# Patient Record
Sex: Female | Born: 1945 | Race: White | Hispanic: No | Marital: Married | Smoking: Never smoker
Health system: Southern US, Community
[De-identification: ages and names within clinical notes are randomized; demographics above are authoritative.]

## PROBLEM LIST (undated history)

## (undated) DIAGNOSIS — H269 Unspecified cataract: Secondary | ICD-10-CM

## (undated) DIAGNOSIS — C801 Malignant (primary) neoplasm, unspecified: Secondary | ICD-10-CM

## (undated) DIAGNOSIS — D649 Anemia, unspecified: Secondary | ICD-10-CM

## (undated) DIAGNOSIS — D7581 Myelofibrosis: Secondary | ICD-10-CM

## (undated) DIAGNOSIS — I10 Essential (primary) hypertension: Secondary | ICD-10-CM

## (undated) HISTORY — DX: Malignant (primary) neoplasm, unspecified: C80.1

## (undated) HISTORY — DX: Unspecified cataract: H26.9

## (undated) HISTORY — DX: Myelofibrosis: D75.81

## (undated) HISTORY — DX: Essential (primary) hypertension: I10

## (undated) HISTORY — PX: COSMETIC SURGERY: SHX468

## (undated) HISTORY — PX: BACK SURGERY: SHX140

## (undated) HISTORY — DX: Anemia, unspecified: D64.9

---

## 1999-02-26 ENCOUNTER — Other Ambulatory Visit: Admission: RE | Admit: 1999-02-26 | Discharge: 1999-02-26 | Payer: Self-pay | Admitting: *Deleted

## 2000-03-10 ENCOUNTER — Other Ambulatory Visit: Admission: RE | Admit: 2000-03-10 | Discharge: 2000-03-10 | Payer: Self-pay | Admitting: *Deleted

## 2004-09-08 HISTORY — PX: UMBILICAL HERNIA REPAIR: SHX196

## 2004-12-02 ENCOUNTER — Encounter: Admission: RE | Admit: 2004-12-02 | Discharge: 2004-12-02 | Payer: Self-pay | Admitting: General Surgery

## 2004-12-03 ENCOUNTER — Ambulatory Visit (HOSPITAL_BASED_OUTPATIENT_CLINIC_OR_DEPARTMENT_OTHER): Admission: RE | Admit: 2004-12-03 | Discharge: 2004-12-03 | Payer: Self-pay | Admitting: General Surgery

## 2004-12-03 ENCOUNTER — Ambulatory Visit (HOSPITAL_COMMUNITY): Admission: RE | Admit: 2004-12-03 | Discharge: 2004-12-03 | Payer: Self-pay | Admitting: General Surgery

## 2005-09-08 HISTORY — PX: APPENDECTOMY: SHX54

## 2006-02-19 IMAGING — CR DG CHEST 2V
2 series · 2 of 2 positions shown · non-contrast
Comparison: none

CLINICAL DATA: Pre op for repair of an umbilical hernia. 
 CHEST ? TWO VIEWS:
 The heart size and mediastinal contours are normal. The lungs are clear. The visualized skeleton is unremarkable.

 IMPRESSION
 No active lung disease.

[view not recorded (1 of 2)]
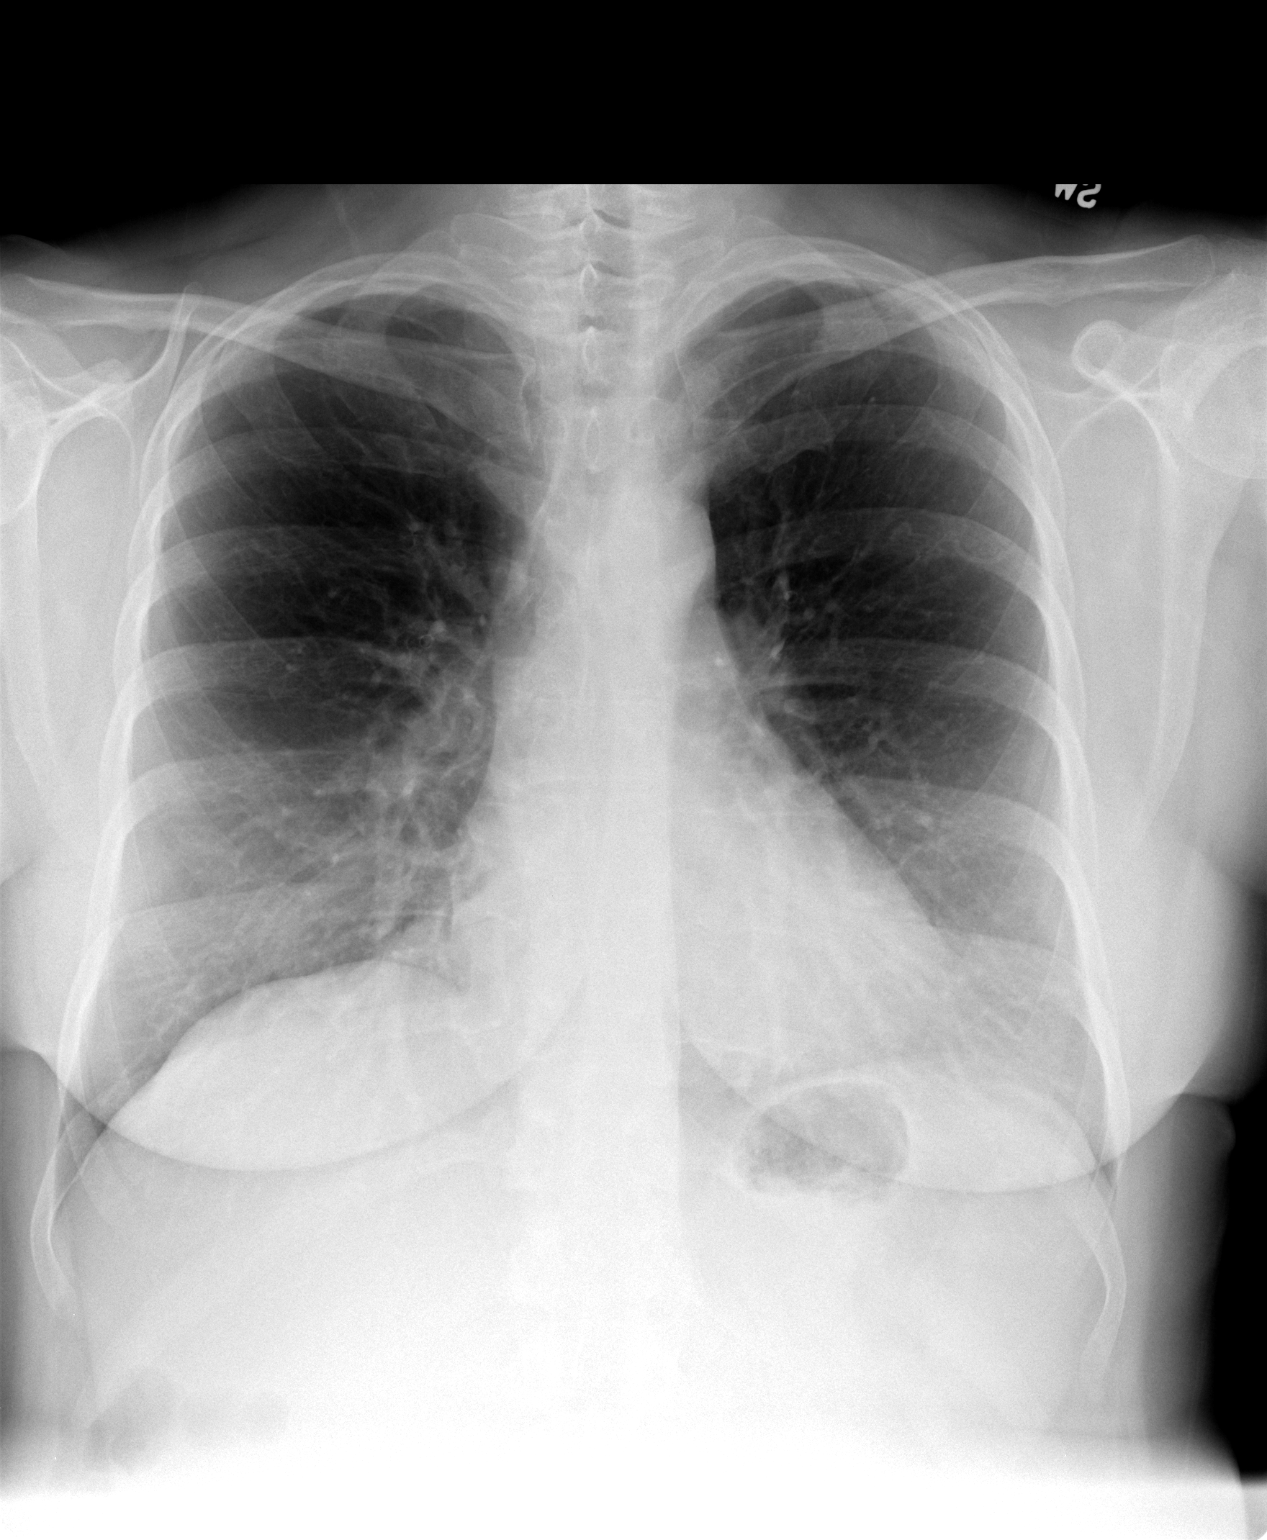

[view not recorded (2 of 2)]
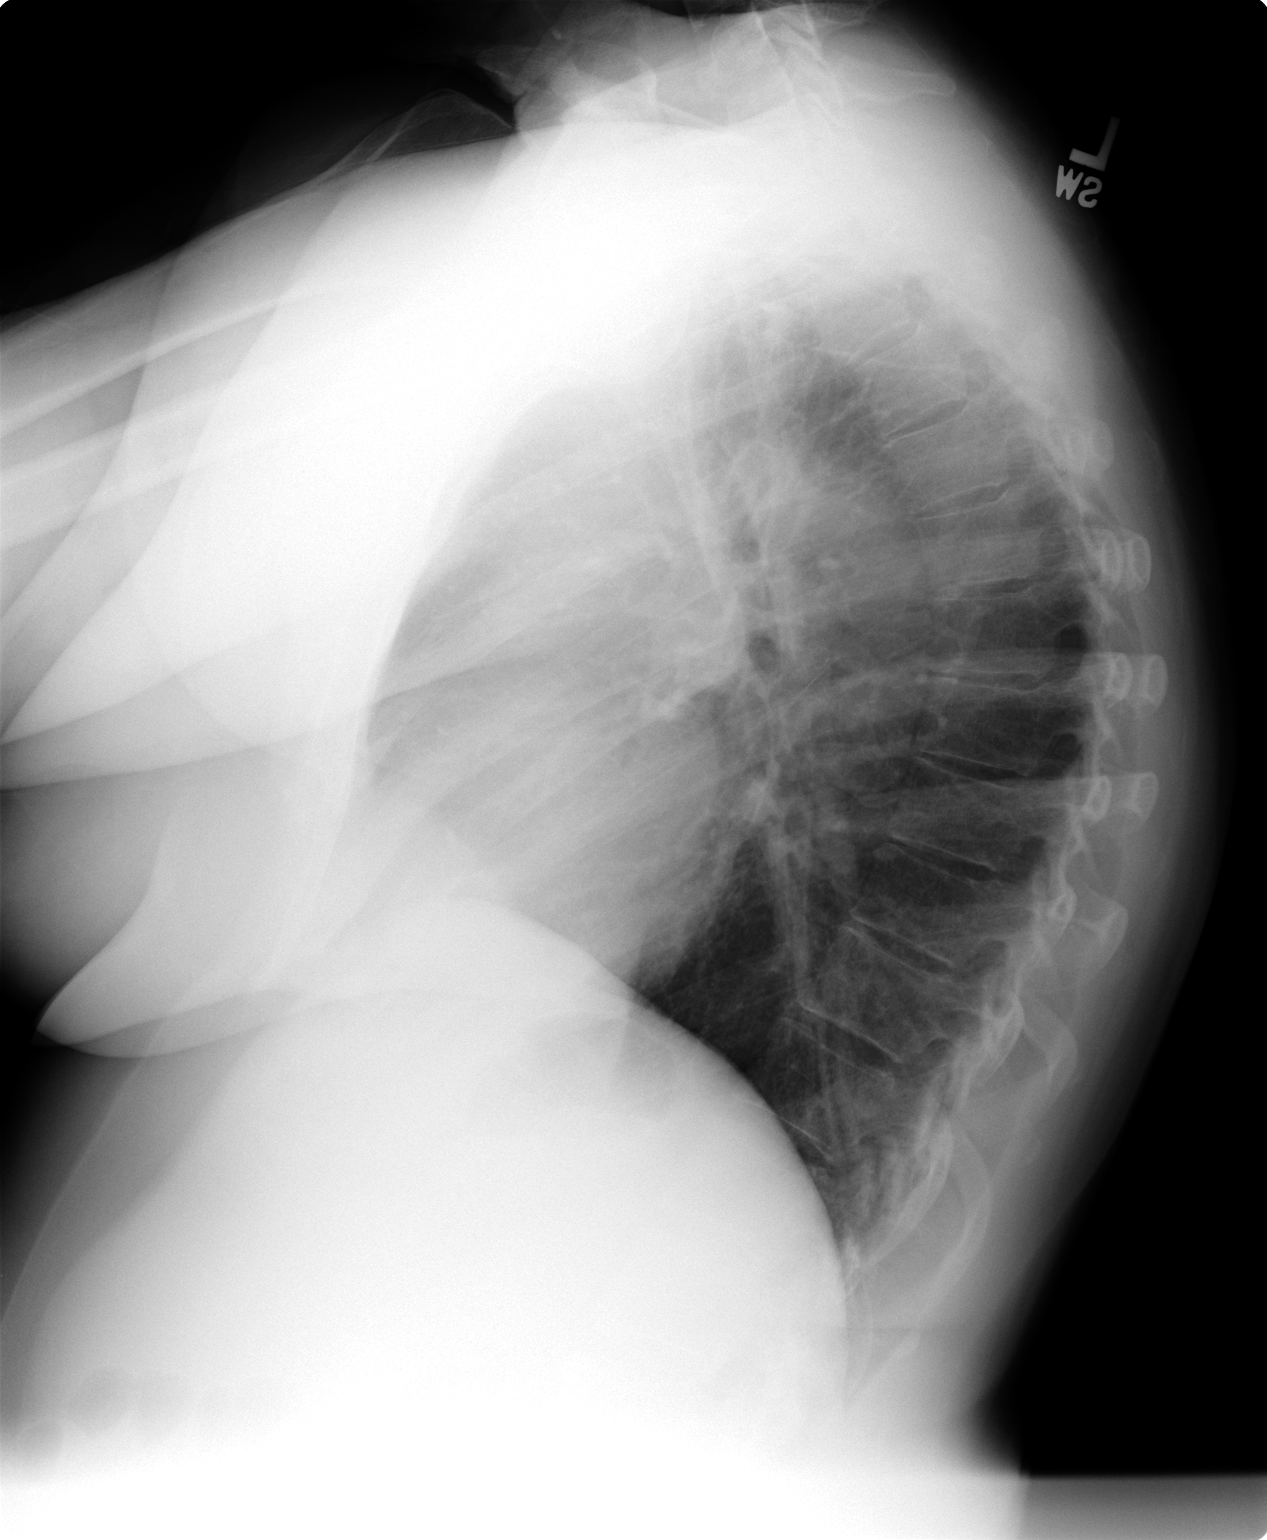

[2 of 2 positions shown; findings below may reference images not displayed]

## 2006-05-11 ENCOUNTER — Inpatient Hospital Stay (HOSPITAL_COMMUNITY): Admission: EM | Admit: 2006-05-11 | Discharge: 2006-05-15 | Payer: Self-pay | Admitting: Emergency Medicine

## 2006-05-11 ENCOUNTER — Encounter (INDEPENDENT_AMBULATORY_CARE_PROVIDER_SITE_OTHER): Payer: Self-pay | Admitting: *Deleted

## 2007-05-29 ENCOUNTER — Encounter: Payer: Self-pay | Admitting: Family Medicine

## 2007-12-31 ENCOUNTER — Encounter: Payer: Self-pay | Admitting: Family Medicine

## 2008-01-10 ENCOUNTER — Ambulatory Visit: Payer: Self-pay | Admitting: Internal Medicine

## 2008-01-13 LAB — MANUAL DIFFERENTIAL
Basophil: 5 % — ABNORMAL HIGH (ref 0–2)
EOS: 1 % (ref 0–7)
LYMPH: 7 % — ABNORMAL LOW (ref 14–49)
MONO: 2 % (ref 0–14)
Metamyelocytes: 15 % — ABNORMAL HIGH (ref 0–0)

## 2008-01-13 LAB — CBC WITH DIFFERENTIAL/PLATELET
HCT: 40 % (ref 34.8–46.6)
MCH: 28.1 pg (ref 26.0–34.0)
MCHC: 33.9 g/dL (ref 32.0–36.0)
Platelets: 442 10*3/uL — ABNORMAL HIGH (ref 145–400)
RBC: 4.82 10*6/uL (ref 3.70–5.32)

## 2008-01-14 LAB — COMPREHENSIVE METABOLIC PANEL
ALT: 23 U/L (ref 0–35)
AST: 29 U/L (ref 0–37)
Albumin: 4.1 g/dL (ref 3.5–5.2)
BUN: 14 mg/dL (ref 6–23)
CO2: 22 mEq/L (ref 19–32)
Calcium: 9.2 mg/dL (ref 8.4–10.5)
Chloride: 106 mEq/L (ref 96–112)
Potassium: 4.8 mEq/L (ref 3.5–5.3)

## 2008-01-14 LAB — LACTATE DEHYDROGENASE: LDH: 1333 U/L — ABNORMAL HIGH (ref 94–250)

## 2008-01-19 ENCOUNTER — Encounter: Payer: Self-pay | Admitting: Internal Medicine

## 2008-01-19 ENCOUNTER — Ambulatory Visit: Payer: Self-pay | Admitting: Internal Medicine

## 2008-01-19 ENCOUNTER — Ambulatory Visit (HOSPITAL_COMMUNITY): Admission: RE | Admit: 2008-01-19 | Discharge: 2008-01-19 | Payer: Self-pay | Admitting: Internal Medicine

## 2008-01-20 LAB — BCR/ABL

## 2008-01-24 ENCOUNTER — Ambulatory Visit (HOSPITAL_COMMUNITY): Admission: RE | Admit: 2008-01-24 | Discharge: 2008-01-24 | Payer: Self-pay | Admitting: Internal Medicine

## 2008-01-28 ENCOUNTER — Encounter: Payer: Self-pay | Admitting: Family Medicine

## 2008-02-28 ENCOUNTER — Ambulatory Visit: Payer: Self-pay | Admitting: Internal Medicine

## 2008-02-28 LAB — COMPREHENSIVE METABOLIC PANEL
AST: 23 U/L (ref 0–37)
Alkaline Phosphatase: 109 U/L (ref 39–117)
BUN: 21 mg/dL (ref 6–23)
Creatinine, Ser: 0.84 mg/dL (ref 0.40–1.20)
Glucose, Bld: 102 mg/dL — ABNORMAL HIGH (ref 70–99)

## 2008-02-28 LAB — CBC WITH DIFFERENTIAL/PLATELET
Basophils Absolute: 0.2 10*3/uL — ABNORMAL HIGH (ref 0.0–0.1)
EOS%: 1.4 % (ref 0.0–7.0)
Eosinophils Absolute: 0.4 10*3/uL (ref 0.0–0.5)
HCT: 35.4 % (ref 34.8–46.6)
MCH: 28.4 pg (ref 26.0–34.0)
NEUT%: 82 % — ABNORMAL HIGH (ref 39.6–76.8)
WBC: 26.2 10*3/uL — ABNORMAL HIGH (ref 3.9–10.0)
lymph#: 2.5 10*3/uL (ref 0.9–3.3)

## 2008-02-28 LAB — LACTATE DEHYDROGENASE: LDH: 843 U/L — ABNORMAL HIGH (ref 94–250)

## 2008-02-28 LAB — TECHNOLOGIST REVIEW

## 2008-06-09 ENCOUNTER — Encounter: Payer: Self-pay | Admitting: Family Medicine

## 2008-10-13 ENCOUNTER — Encounter: Payer: Self-pay | Admitting: Family Medicine

## 2009-04-12 IMAGING — US US ABDOMEN COMPLETE
1 series · 13 of 25 positions shown · non-contrast
Comparison: CT on 05/11/2006

CLINICAL DATA: Splenomegaly

ABDOMEN ULTRASOUND
TECHNIQUE: Complete abdominal ultrasound examination was performed
including evaluation of the liver, gallbladder, bile ducts,
pancreas, kidneys, spleen, IVC, and abdominal aorta.

[Series 1: unknown · 0.33mm/px · 13 of 108 slices shown]
[im 1/108]
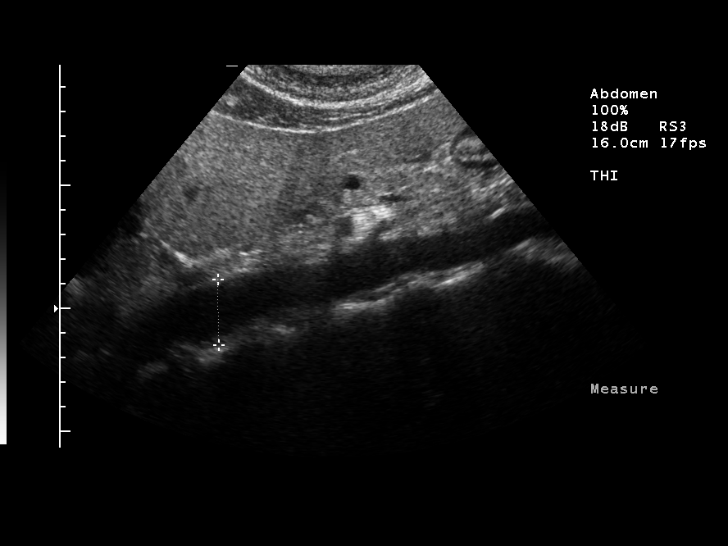
[im 9/108]
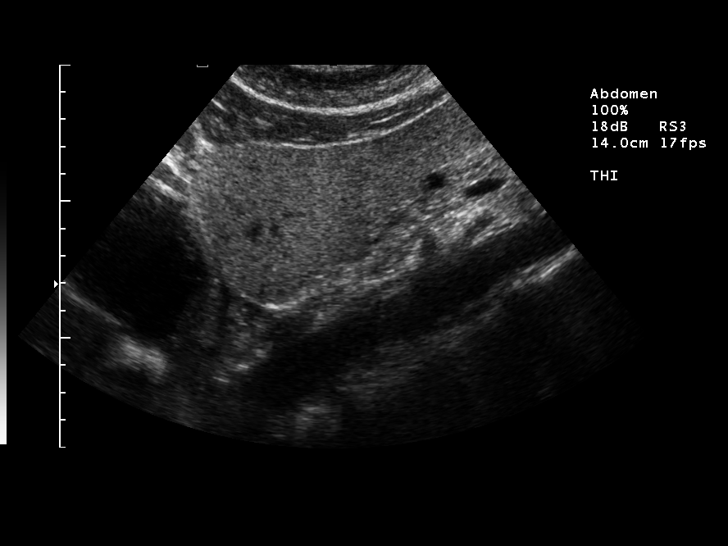
[im 18/108]
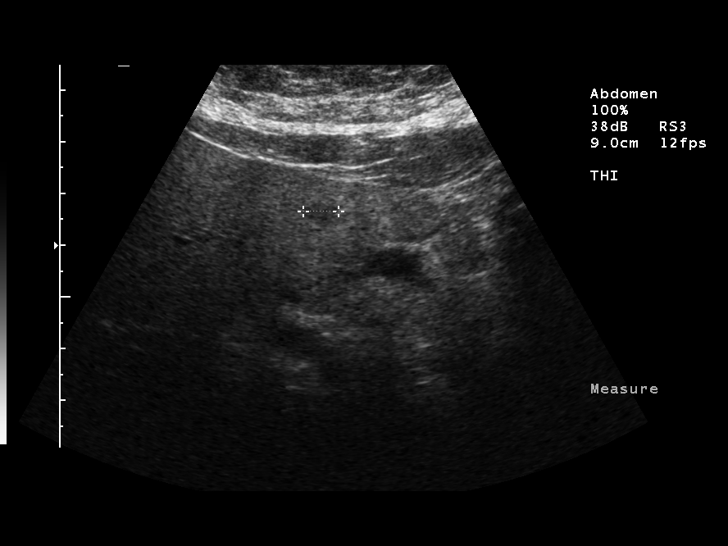
[im 27/108]
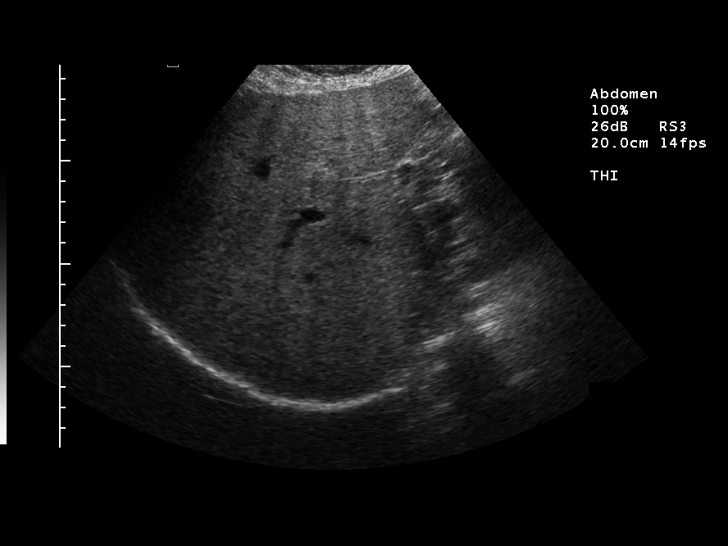
[im 36/108]
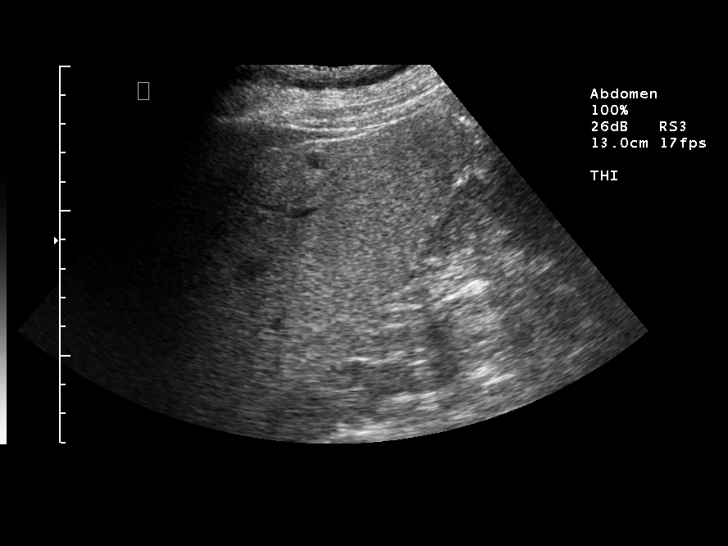
[im 45/108]
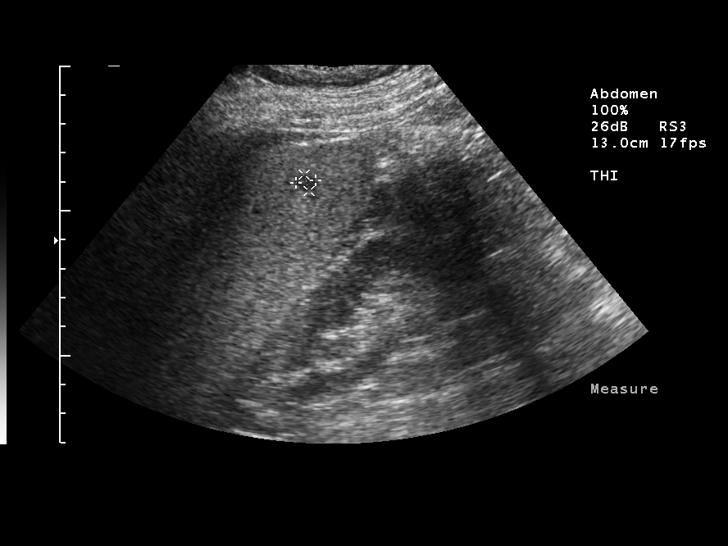
[im 54/108]
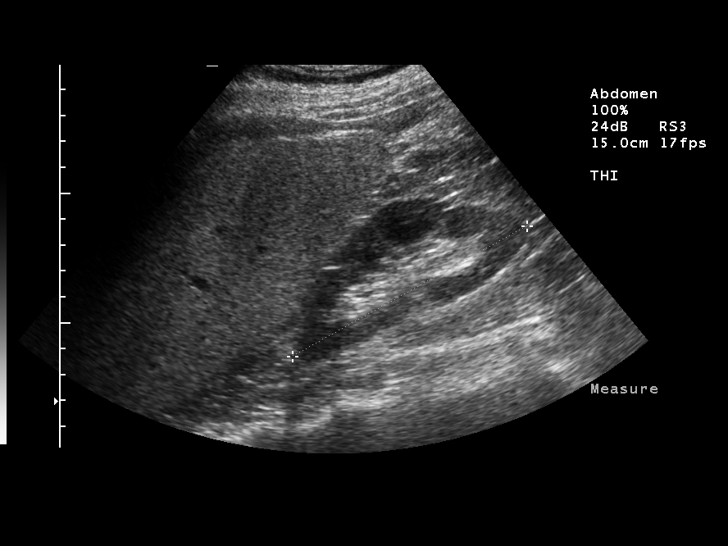
[im 63/108]
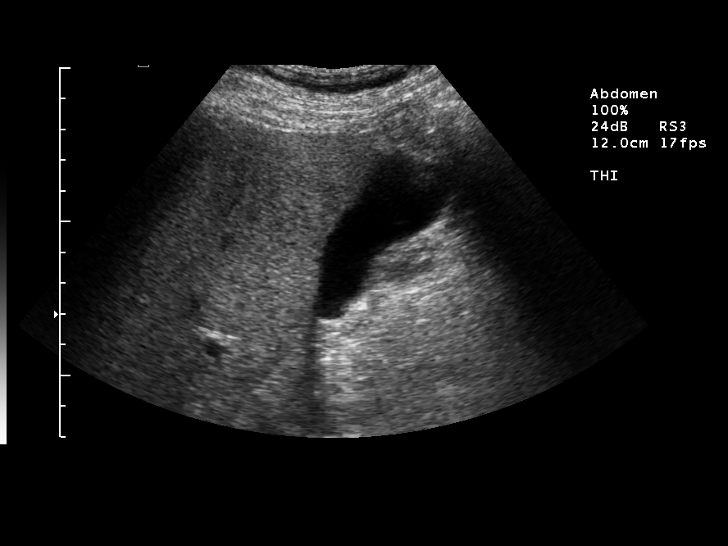
[im 72/108]
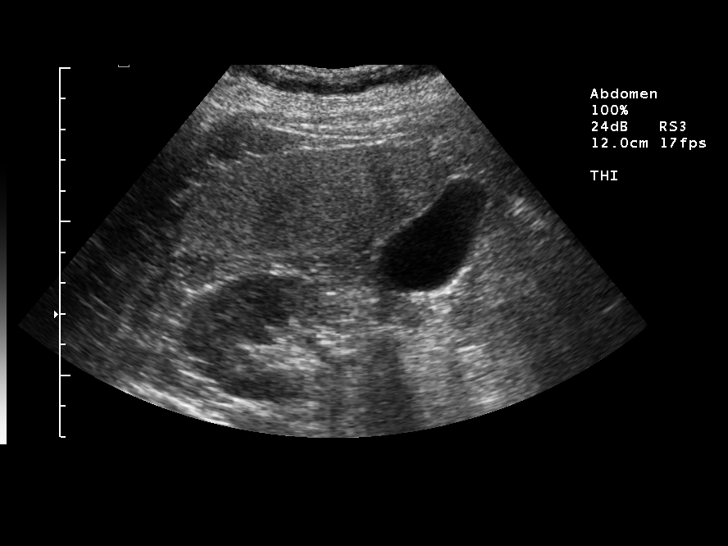
[im 81/108]
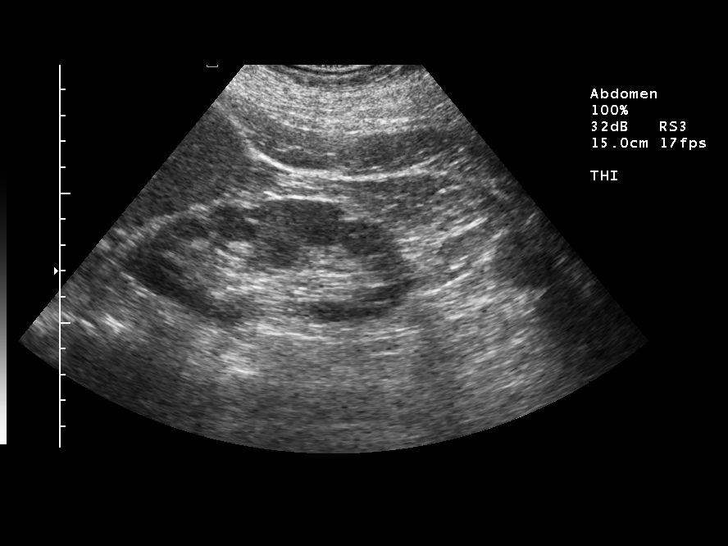
[im 90/108]
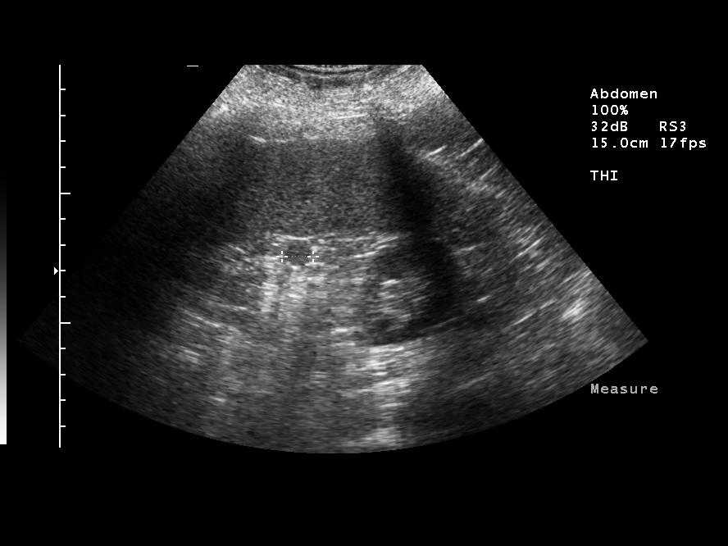
[im 99/108]
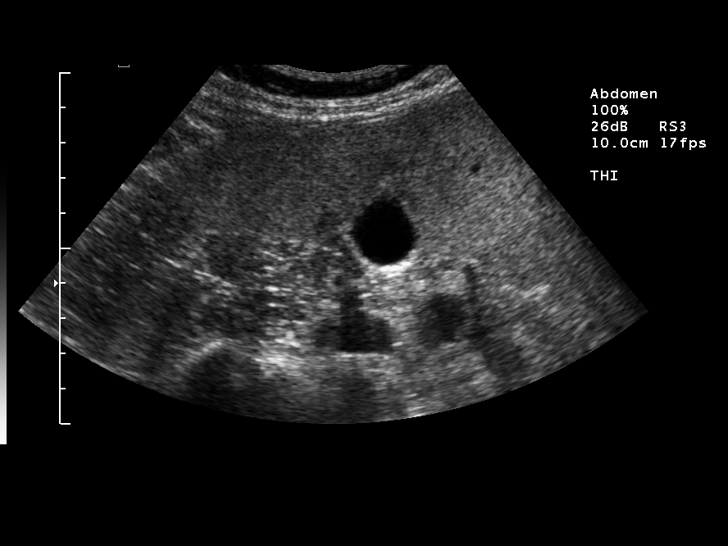
[im 108/108]
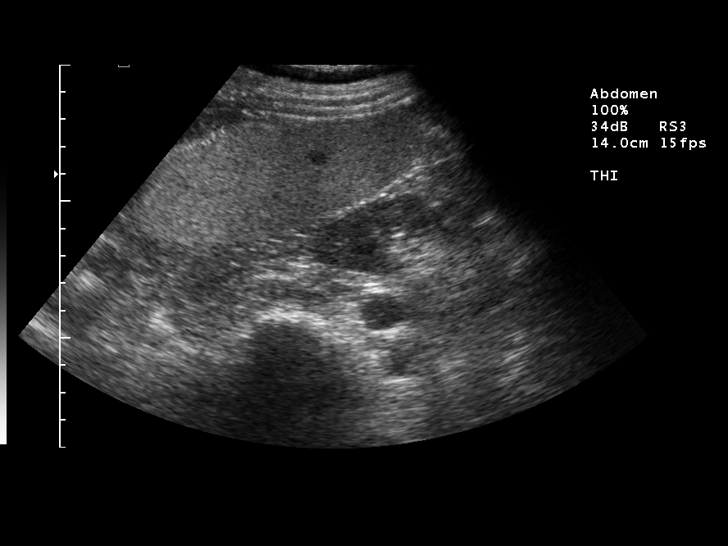

[13 of 25 positions shown; findings below may reference images not displayed]

FINDINGS: The spleen is mildly enlarged, measuring approximate 14
cm length.  This is slightly greater than on prior CT 3229.  No
splenic lesions are identified sonographically.

The liver shows diffusely increased parenchymal echogenicity,
consistent with diffuse fatty infiltration.  Several tiny less than
1 cm hypoechoic lesions are seen within the liver, which are too
small to characterize, but likely represent tiny hepatic cysts.  At
least one of these was seen in the right hepatic lobe on prior CT.
There is no evidence of gallstones or gallbladder wall thickening.
There is no evidence of biliary ductal dilatation, with common bile
duct measuring approximately 2 mm.

The visualized portions of the IVC and pancreas are unremarkable.
Both kidneys are normal in size and appearance.  There is no
evidence of renal mass or hydronephrosis.  The abdominal aorta is
nondilated.  There is no evidence of ascites.
IMPRESSION: 1.  Mild splenomegaly.  No evidence of ascites.
2.  Diffuse fatty infiltration of the liver.
3.  No evidence of gallstones or biliary ductal dilatation.

## 2009-07-10 ENCOUNTER — Encounter: Payer: Self-pay | Admitting: Family Medicine

## 2009-10-08 ENCOUNTER — Ambulatory Visit: Payer: Self-pay | Admitting: Family Medicine

## 2009-10-08 DIAGNOSIS — M899 Disorder of bone, unspecified: Secondary | ICD-10-CM | POA: Insufficient documentation

## 2009-10-08 DIAGNOSIS — M949 Disorder of cartilage, unspecified: Secondary | ICD-10-CM

## 2009-10-08 DIAGNOSIS — D7581 Myelofibrosis: Secondary | ICD-10-CM | POA: Insufficient documentation

## 2009-10-08 DIAGNOSIS — I10 Essential (primary) hypertension: Secondary | ICD-10-CM | POA: Insufficient documentation

## 2009-10-23 ENCOUNTER — Encounter: Payer: Self-pay | Admitting: Family Medicine

## 2010-02-01 ENCOUNTER — Ambulatory Visit: Payer: Self-pay | Admitting: Family Medicine

## 2010-02-05 ENCOUNTER — Ambulatory Visit: Payer: Self-pay | Admitting: Family Medicine

## 2010-02-05 LAB — CONVERTED CEMR LAB
CO2: 29 meq/L (ref 19–32)
Chloride: 103 meq/L (ref 96–112)
Glucose, Bld: 97 mg/dL (ref 70–99)
Potassium: 5.8 meq/L — ABNORMAL HIGH (ref 3.5–5.1)
Sodium: 140 meq/L (ref 135–145)

## 2010-06-20 ENCOUNTER — Ambulatory Visit: Payer: Self-pay | Admitting: Family Medicine

## 2010-10-08 NOTE — Letter (Signed)
Summary: [facility]  [facility]   Imported By: Maryln Gottron 10/10/2009 15:32:33  _____________________________________________________________________  External Attachment:    Type:   Image     Comment:   External Document

## 2010-10-08 NOTE — Assessment & Plan Note (Signed)
Summary: flu shot//lch  Nurse Visit   Allergies: No Known Drug Allergies  Orders Added: 1)  Admin 1st Vaccine [90471] 2)  Flu Vaccine 17yrs + [65784]  Flu Vaccine Consent Questions     Do you have a history of severe allergic reactions to this vaccine? no    Any prior history of allergic reactions to egg and/or gelatin? no    Do you have a sensitivity to the preservative Thimersol? no    Do you have a past history of Guillan-Barre Syndrome? no    Do you currently have an acute febrile illness? no    Have you ever had a severe reaction to latex? no    Vaccine information given and explained to patient? yes    Are you currently pregnant? no    Lot Number:[id number]   Exp Date:03/08/2011   Site Given  Left Deltoid IM Romualdo Bolk, CMA Duncan Dull)  June 20, 2010 1:57 PM

## 2010-10-08 NOTE — Letter (Signed)
Summary: [facility] [place]   Imported By: Maryln Gottron 10/10/2009 15:28:05  _____________________________________________________________________  External Attachment:    Type:   Image     Comment:   External Document

## 2010-10-08 NOTE — Letter (Signed)
Summary: [facility] [place]   Imported By: Maryln Gottron 10/10/2009 15:29:17  _____________________________________________________________________  External Attachment:    Type:   Image     Comment:   External Document

## 2010-10-08 NOTE — Letter (Signed)
Summary: [facility]   Imported By: Maryln Gottron 11/02/2009 11:03:41  _____________________________________________________________________  External Attachment:    Type:   Image     Comment:   External Document

## 2010-10-08 NOTE — Letter (Signed)
Summary: [facility]  [facility]   Imported By: Maryln Gottron 10/10/2009 15:24:04  _____________________________________________________________________  External Attachment:    Type:   Image     Comment:   External Document

## 2010-10-08 NOTE — Assessment & Plan Note (Signed)
Summary: BRAND NEW PT TO LBF/TO ESTABLISH/CJR   Vital Signs:  Patient profile:   65 year old female Menstrual status:  postmenopausal Height:      63.75 inches Weight:      165 pounds BMI:     28.65 Temp:     98.7 degrees F oral Pulse rate:   78 / minute Pulse rhythm:   regular Resp:     12 per minute BP sitting:   142 / 80  (left arm) Cuff size:   regular  Vitals Entered By: Sid Falcon LPN (October 08, 2009 11:25 AM) CC: New pt to establish Is Patient Diabetic? No     Menstrual Status postmenopausal   History of Present Illness: New to establish care. Past medical history significant for osteopenia, hypertension, basal cell cancer and myelofibrosis which was diagnosed April 2009. [facility]ed [facility] with monthly lab work for her myelofibrosis. She has chronic anemia related to that. Also continues to see dermatologist regularly.  Surgical history significant for ruptured appendix 2007, umbilical hernia repair 2006 and low back surgery 1985.  Medications include lisinopril HCTZ, allopurinol, and hydroxyurea. Patient had flu vaccine earlier this year as well as Pneumovax. Has questions regarding calcium and vitamin D supplementation.  Preventive Screening-Counseling & Management  Alcohol-Tobacco     Smoking Status: never  Allergies (verified): No Known Drug Allergies  Past History:  Family History: Last updated: 10/08/2009 Heart disease, Father massive heart attack age 72  Social History: Last updated: 10/08/2009 Retired Married Never Smoked Alcohol use-no 2 pregnancies 2 live births  Risk Factors: Smoking Status: never (10/08/2009)  Past Medical History: Myelofibrosis Chicken pox Hypertension Oseopenia  Past Surgical History: Appendectomy 2007 Umbilical Hernia 2006 Basal Skin Cancer, 2008  Family History: Heart disease, Father massive heart attack age 52  Social History: Retired Married Never Smoked Alcohol use-no 2  pregnancies 2 live births Smoking Status:  never  Review of Systems  The patient denies anorexia, fever, weight loss, chest pain, syncope, peripheral edema, prolonged cough, hemoptysis, abdominal pain, muscle weakness, depression, and enlarged lymph nodes.         mild dyspnea which is chronic and unchanged.  Physical Exam  General:  Well-developed,well-nourished,in no acute distress; alert,appropriate and cooperative throughout examination Eyes:  No corneal or conjunctival inflammation noted. EOMI. Perrla. Funduscopic exam benign, without hemorrhages, exudates or papilledema. Vision grossly normal. Mouth:  Oral mucosa and oropharynx without lesions or exudates.  Teeth in good repair. Neck:  No deformities, masses, or tenderness noted. Lungs:  Normal respiratory effort, chest expands symmetrically. Lungs are clear to auscultation, no crackles or wheezes. Heart:  normal rate, regular rhythm, and no murmur.   Extremities:  No clubbing, cyanosis, edema, or deformity noted with normal full range of motion of all joints.     Impression & Recommendations:  Problem # 1:  HYPERTENSION (ICD-401.9)  Her updated medication list for this problem includes:    Lisinopril-hydrochlorothiazide 10-12.5 Mg Tabs (Lisinopril-hydrochlorothiazide) ..... Once daily  Problem # 2:  MYELOFIBROSIS (OZD-664.40) Oncology at [facility].  Problem # 3:  OSTEOPENIA (ICD-733.90) discussed adequate Ca and VitD.  consider Vit D level at f/u in 4 lmonths.  Complete Medication List: 1)  Lisinopril-hydrochlorothiazide 10-12.5 Mg Tabs (Lisinopril-hydrochlorothiazide) .... Once daily 2)  Allopurinol 300 Mg Tabs (Allopurinol) .... Once daily 3)  Hydroxyurea 500 Mg Caps (Hydroxyurea) .... 2 capsules mon, tues, thurs, fri, sat, 1 capsule wed and sun  Patient Instructions: 1)  Take calcium +vitamin D daily.  Calcium 1,000 mg/day and Vit D 800 International Units/day. 2)  Please schedule a follow-up appointment in 4  months .  3)  It is important that you exercise reguarly at least 20 minutes 5 times a week. If you develop chest pain, have severe difficulty breathing, or feel very tired, stop exercising immediately and seek medical attention. ' 4)  Vit D level at follow up.  Preventive Care Screening  Mammogram:    Date:  09/08/2001    Results:  normal

## 2010-10-08 NOTE — Assessment & Plan Note (Signed)
Summary: 4 month rov/njr   Vital Signs:  Patient profile:   65 year old female Menstrual status:  postmenopausal Weight:      163 pounds Temp:     97.8 degrees F oral BP sitting:   132 / 80  (left arm) Cuff size:   regular  Vitals Entered By: Sid Falcon LPN (Feb 01, 2010 8:22 AM) CC: 4 month follow-up visit, Hypertension Management   History of Present Illness: Patient here for followup hypertension.  Since last visit had squamous cell removed right hand. Also previous history of basal cell carcinoma left arm. Followed closely by dermatology. Remains on lisinopril HCTZ for hypertension. Does not monitor blood pressure at home. No recent electrolytes.  Hypertension History:      She denies headache, chest pain, palpitations, dyspnea with exertion, orthopnea, PND, peripheral edema, visual symptoms, neurologic problems, syncope, and side effects from treatment.  She notes no problems with any antihypertensive medication side effects.        Positive major cardiovascular risk factors include female age 35 years old or older and hypertension.  Negative major cardiovascular risk factors include non-tobacco-user status.     Allergies: No Known Drug Allergies  Past History:  Past Medical History: Myelofibrosis Chicken pox Hypertension Oseopenia Squamous cell R hand Basal cell L arm PMH reviewed for relevance  Review of Systems  The patient denies anorexia, fever, weight loss, weight gain, chest pain, syncope, dyspnea on exertion, peripheral edema, prolonged cough, headaches, hemoptysis, abdominal pain, melena, and hematochezia.    Physical Exam  General:  Well-developed,well-nourished,in no acute distress; alert,appropriate and cooperative throughout examination Mouth:  Oral mucosa and oropharynx without lesions or exudates.  Teeth in good repair. Neck:  No deformities, masses, or tenderness noted. Lungs:  Normal respiratory effort, chest expands symmetrically. Lungs are  clear to auscultation, no crackles or wheezes. Heart:  Normal rate and regular rhythm. S1 and S2 normal without gallop, murmur, click, rub or other extra sounds. Extremities:  No clubbing, cyanosis, edema, or deformity noted with normal full range of motion of all joints.     Impression & Recommendations:  Problem # 1:  HYPERTENSION (ICD-401.9)  Her updated medication list for this problem includes:    Lisinopril-hydrochlorothiazide 10-12.5 Mg Tabs (Lisinopril-hydrochlorothiazide) ..... Once daily  Orders: Venipuncture (16109) TLB-BMP (Basic Metabolic Panel-BMET) (80048-METABOL)  Complete Medication List: 1)  Lisinopril-hydrochlorothiazide 10-12.5 Mg Tabs (Lisinopril-hydrochlorothiazide) .... Once daily 2)  Allopurinol 300 Mg Tabs (Allopurinol) .... Once daily 3)  Hydroxyurea 500 Mg Caps (Hydroxyurea) .... 2 capsules mon, tues, thurs, fri, sat, 1 capsule wed and sun 4)  Calcium Citrate-vitamin D 315-200 Mg-unit Tabs (Calcium citrate-vitamin d) .... Once daily  Hypertension Assessment/Plan:      The patient's hypertensive risk group is category B: At least one risk factor (excluding diabetes) with no target organ damage.  Today's blood pressure is 132/80.    Patient Instructions: 1)  Check your  Blood Pressure regularly . If it is above: 140/90  you should make an appointment. 2)  Please schedule a follow-up appointment in 6 months .

## 2011-01-24 NOTE — Op Note (Signed)
[name], [name] NO.:  [id number]   MEDICAL RECORD NO.:  [id number]          PATIENT TYPE:  INP   LOCATION:  0101                         FACILITY:  [facility]   PHYSICIAN:  Anselm Pancoast. Weatherly, M.D.DATE OF BIRTH:  [birth year]   DATE OF PROCEDURE:  05/11/2006  DATE OF DISCHARGE:                                 OPERATIVE REPORT   PREOPERATIVE DIAGNOSIS:  Appendicitis, probably ruptured retrocecal.   POSTOPERATIVE DIAGNOSIS:  Ruptured retrocecal appendicitis.   ANESTHESIA:  General.   SURGEON:  Anselm Pancoast. Zachery Dakins, M.D.   ASSISTANT:  Nurse.   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 Has not had nausea and  vomiting and then yesterday probably about 1 o'clock she started having  significant pain in lower right abdomen, then fever.  This progressed, got  worse and approximately 1 a.m., they elected to come to the emergency room.  She was seen by the ER physician. When she checked in, she had an elevated  pulse of 115, a temperature of 99.9.  A white count and urinalysis were  obtained. White count was 21,800 and there was significant left shift and  urinalysis was unremarkable.  CMET was unremarkable.  CT scan was ordered  and then this was completed approximately 8:30 a.m. with the findings of  acute appendicitis, marked inflammation and probably ruptured.  I was asked  to see the patient and saw her approximately __________  and she was  definitely very tender in the right lower quadrant. Temperature was now  101.9 and she was given 3 g of Unasyn and discussed with her that we needed  to do an urgent appendectomy.  She has had a previous umbilical hernia  repaired with mesh by Dr. Abbey Chatters approximately a year ago with on Onlay  mesh and I thought that it would be better to go ahead and do an  open  appendectomy with a right lower quadrant incision instead of trying to do  anything laparoscopically through the mesh with the retrocecal and probably  ruptured appendix.  I thought this would definitely be the best approach.  The patient was in agreement and the on-call team was called in as another  case was actually going on because of her fever, tachycardia and findings.   DESCRIPTION OF PROCEDURE:  She was taken to the operating suite.  Induction  of general anesthesia, endotracheal tube, oral tube, the stomach sucked out.  Foley catheter was inserted and she has PEEL-AWAY SHEATH stockings.  The  abdomen was prepped with Betadine surgical scrub and solution and with the  patient relaxed, you could feel a definite fullness in the right lower  quadrant.  I made a little transverse incision at McBurney's point.  She has  a moderate amount of adipose tissue but the muscles were all fairly thinned  out and went through the external oblique, internal oblique, transversalis,  and picked it up  with the peritoneum and carefully opened it  to the  peritoneal cavity.  There is an inflammatory mass in the right lower  quadrant.  You could see the markedly inflamed appendix.  A portion of the  appendix was in the abdomen and had actually perforated really right at the  peritoneal reflection area with a __________  and right angle.  We kind of  freed up the surrounding peritoneum so I could go to the base of the  appendix.  The fluid, which appears to be reasonably well localized, was  cultured anaerobically and then after the mesentery vessels had been  controlled, I used a TA-30 to go across the base of the appendix right at  the cecum.  This all removed from the field and then thoroughly irrigated,  aspirated and irrigated. No evidence of any bleeding.  Then I inverted the  little suture line with Lembert sutures of 3-0 silk.  We washed, irrigated,  washed, aspirated and then elected to go  ahead and close the incision with a  running 2-0 Vicryl on the peritoneum and transversalis, interrupted sutures  on the external oblique and more laterally catching a few of the fibers of  the external oblique.  The wound was irrigated between layers.  I placed a  quarter-inch Penrose drain anterior to the fascia with two sutures of 4-0  Vicryl to approximate the Scarpa's fascia. Then a few skin staples.  The  drain comes out medially and laterally and we will watch it carefully.   If there is any question of developing cellulitis in the wound, we will take  the staples out and I will add Flagyl to her antibiotic regimen.  She will  be kept NPO.  Keep the Foley until tomorrow. She has PAS stockings.  Hopefully, this will be kind of a prompt recovery without developing any  intra-abdominal abscesses.  I did not find anything and I think the  __________  peritoneal drain and the little leakage was not only confined to  the base of the appendix based in the cecal area and not a true generalized  peritonitis fortunately.           ______________________________  Anselm Pancoast. Zachery Dakins, M.D.     WJW/MEDQ  D:  05/11/2006  T:  05/11/2006  Job:  [id number]   cc:   Marjory Lies, M.D.  Fax: [phone]

## 2011-01-24 NOTE — H&P (Signed)
NAMEAUBRIE, LUCIEN NO.:  [id number]   MEDICAL RECORD NO.:  [id number]          PATIENT TYPE:  INP   LOCATION:  0101                         FACILITY:  [facility]   PHYSICIAN:  Anselm Pancoast. Weatherly, M.D.DATE OF BIRTH:  [birth year]   DATE OF ADMISSION:  05/11/2006  DATE OF DISCHARGE:                                HISTORY & PHYSICAL   CHIEF COMPLAINT:  Abdominal pain, lower abdomen.   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 The patient states that  over the last several days she has felt tired with maybe a little bit of  bowel irregularity but has not had nausea and vomiting, and then yesterday I  think about 2 in the afternoon, she started having pretty significant  abdominal pain and waited until approximately 2 a.m. to come to the  emergency room. When she arrived her, she did have a tachycardia and a  temperature of approximately 100. She was definitely tender in the lower  abdomen, and IV was started, pain medications were given and white count was  21,800. CT was ordered and has now been completed, and it shows an  inflammatory process in the right lower quadrant behind the cecum that is  most likely ruptured retrocecal appendicitis. The patient is not on any type  of chronic medications. Her only previous surgery was an umbilical hernia  repair with a piece of mesh Onlay done by Dr. Zoila Shutter a little over a year  ago. I am not sure whether she has ever had a colon exam, and she is not on  any type of blood thinners or cardiac medications. 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   FAMILY HISTORY:  Noncontributory.   CHRONIC MEDICATIONS:  None.   The only surgery was the umbilical hernia repair.   The patient is married, accompanied by her husband and some children, and  says she  spends her time caring for grandchildren and her animals.   PHYSICAL EXAMINATION:  GENERAL:  She is a pleasant, slightly overweight  female in kind of mild discomfort with kind of a distended abdomen.  VITAL SIGNS:  Initially, temperature was 99.9, blood pressure 159/88, pulse  115, respirations 20. She is followed by Dr. Doristine Counter at [facility].  HEENT:  She appears adequately hydrated.  NECK:  No cervical or supraclavicular lymphadenopathy.  CHEST:  Good breath sounds bilaterally.  CARDIAC:  She has got a sinus tachycardia. I do not appreciate any murmurs.  BREASTS:  Unremarkable.  ABDOMEN:  She is mildly distended. A lower incision just below the naval  with no evidence of any fascial defect. She is definitely tender with muscle  guarding in the right lower quadrant. Did not do a rectal or pelvic exam  since the CT confirms the acute inflammatory process or ruptured  appendicitis.   PLAN:  The patient is being started on IV Unasyn. We will notify the OR and  see when an  OR can be available to do an open appendectomy. With the mesh in  the umbilicus, I think the right lower quadrant incision would be the best  appropriate management, and I discussed with the patient and her husband  about the ruptured appendicitis is, of  course, more of a serious problem than the usual early appendicitis. It  appears that she has been having kind of vague symptoms for 3 or 4 days but  not really the nausea and vomiting and the most significant pain until  yesterday afternoon when she probably ruptured her appendix at that time  clinically.           ______________________________  Anselm Pancoast. Zachery Dakins, M.D.     WJW/MEDQ  D:  05/11/2006  T:  05/11/2006  Job:  [id number]

## 2011-01-24 NOTE — Discharge Summary (Signed)
NAMEJYL, CHICO NO.:  [id number]   MEDICAL RECORD NO.:  [id number]          PATIENT TYPE:  INP   LOCATION:  1610                         FACILITY:  [facility]   PHYSICIAN:  Anselm Pancoast. Weatherly, M.D.DATE OF BIRTH:  18-Nov-1945   DATE OF ADMISSION:  05/11/2006  DATE OF DISCHARGE:  05/15/2006                                 DISCHARGE SUMMARY   DISCHARGE DIAGNOSIS:  Ruptured retrocecal appendicitis.   OPERATION:  Open appendectomy.   HISTORY:  Emily Taylor is a 65 year old Caucasian female who presented to  the emergency room May 11, 2006, with approximately 24-hour history of  significant lower right abdominal pain.  She said she has felt tired and not  as perky over the last several days but really started having pain  approximately 2:00 p.m. the afternoon prior to presenting to the emergency  room.  It became more intense and then approximately 2:00 a.m. she presented  to the emergency room.  When she arrived she had a temperature of  approximately 100.  She was definitely tender in the lower abdomen.  Her  white count was 21,800.  CT was ordered and this showed a inflammatory  process in the right lower quadrant behind cecum most likely a ruptured  appendicitis.  The patient is not on any type of chronic medication.  Her  only surgery was an umbilical herniorrhaphy, Dr. Abbey Chatters did  approximately 1 year ago.  She is on no hypertensive medications or blood  thinner.   On physical exam, she was definitely tender in the right lower quadrant and  with the mesh at the umbilicus and this looked like a ruptured retrocecal  appendicitis and I recommended we proceed on with open appendectomy.  She  was started on Unasyn and taken to surgery.  General anesthesia and a open  appendectomy was performed.  She did have a very inflamed appendix.  The  periappendiceal fluid, etc. grew E. coli and the path report is consistent  with acute appendicitis with kind of  a  localized rupture.  I have treated  her with Unasyn and Flagyl intravenously.  Originally, she had fever up to  about 102 immediately after surgery, did not want to cough and was on  supplemental oxygen with pulmonary toilet, etc. that improved.  Her  temperature gradually went away, her white count that was so elevated was  down to 13,800 the following morning and then on May 14, 2006, has  been down to 9700.  I had placed a little Penrose drain under the  subcutaneous tissue which was brought medial and laterally to hopefully  prevent a wound infection and that Penrose drain was removed yesterday and  it appears to be healing satisfactorily.  I think she is ready for discharge  now in improved condition and we will keep her on Augmentin for  approximately 3-4 days and Vicodin for pain.  She can drive when she is not  requiring the Vicodin.  She will call an appointment to see [place] in the office  early next week for follow-up.  She has got four skin staples kind  of  holding the skin loosely together, but there is certainly no evidence of any  infection or tenderness on physical exam now and hopefully this is not going  to develop wound infection.           ______________________________  Anselm Pancoast. Zachery Dakins, M.D.    WJW/MEDQ  D:  05/15/2006  T:  05/15/2006  Job:  [id number]   cc:   Marjory Lies, M.D.  Fax: [phone]

## 2013-03-24 ENCOUNTER — Telehealth: Payer: Self-pay | Admitting: Family Medicine

## 2013-03-24 NOTE — Telephone Encounter (Signed)
PT Husband Jonny Ruiz called to request that the pt be seen by her PCP asap. He stated that she has cancer, and her oncologist is requesting that she be seen regarding her blood pressure and medications. She has not been seen in a little over three years, and now has medicare. Would you be willing to see her?  Mobile number; [phone]

## 2013-03-24 NOTE — Telephone Encounter (Signed)
yes

## 2013-03-24 NOTE — Telephone Encounter (Signed)
Appointments scheduled

## 2013-03-25 ENCOUNTER — Ambulatory Visit (INDEPENDENT_AMBULATORY_CARE_PROVIDER_SITE_OTHER): Payer: Medicare Other | Admitting: Family Medicine

## 2013-03-25 ENCOUNTER — Encounter: Payer: Self-pay | Admitting: Family Medicine

## 2013-03-25 VITALS — BP 140/72 | HR 96 | Temp 97.9°F | Wt 152.0 lb

## 2013-03-25 DIAGNOSIS — I10 Essential (primary) hypertension: Secondary | ICD-10-CM

## 2013-03-25 MED ORDER — LISINOPRIL-HYDROCHLOROTHIAZIDE 10-12.5 MG PO TABS: 1.0000 | ORAL_TABLET | Freq: Every day | ORAL | Status: DC

## 2013-03-25 NOTE — Progress Notes (Signed)
  Subjective:    Patient ID: Emily Taylor, female    DOB: 05-09-1946, 67 y.o.   MRN: [id number]  HPI Patient's history of myelofibrosis followed at [facility]. She's not been seen here in over 3 years. She has hypertension treated with lisinopril HCTZ. They have been refilling that over at [facility]. Apparently recently they asked that she followup with her primary to get this filled further. She gets regular lab work including recent electrolytes back in June with normal potassium and normal renal function. She remains on allopurinol and hydroxyurea regarding her myelofibrosis. This was diagnosed approximately 5 years ago. She's done remarkably well and she states she was initially given five-year life expectancy and is already exceeded that.  Past Medical History  Diagnosis Date  . Anemia   . Cancer   . Cataract   . Hypertension    Past Surgical History  Procedure Laterality Date  . Back surgery    . Appendectomy    . Cosmetic surgery      reports that she has never smoked. She does not have any smokeless tobacco history on file. She reports that she does not drink alcohol or use illicit drugs. family history includes Cancer in her mother; Dementia in her mother; Heart disease in her father; and Hypertension in her father. No Known Allergies    Review of Systems  Constitutional: Positive for fatigue. Negative for unexpected weight change.  Eyes: Negative for visual disturbance.  Respiratory: Negative for cough, chest tightness, shortness of breath and wheezing.   Cardiovascular: Negative for chest pain, palpitations and leg swelling.  Neurological: Negative for dizziness, seizures, syncope, weakness, light-headedness and headaches.       Objective:   Physical Exam  Constitutional: She appears well-developed and well-nourished.  Neck: Neck supple. No thyromegaly present.  Cardiovascular: Normal rate and regular rhythm.   Pulmonary/Chest: Effort normal and breath  sounds normal. No respiratory distress. She has no wheezes. She has no rales.  Musculoskeletal: She exhibits no edema.          Assessment & Plan:  Hypertension. Stable. Refill lisinopril HCTZ for one year. She has followup visit in one week to reestablish

## 2013-03-30 ENCOUNTER — Ambulatory Visit (INDEPENDENT_AMBULATORY_CARE_PROVIDER_SITE_OTHER): Payer: Medicare Other | Admitting: Family Medicine

## 2013-03-30 ENCOUNTER — Encounter: Payer: Self-pay | Admitting: Family Medicine

## 2013-03-30 VITALS — BP 132/68 | HR 98 | Temp 98.2°F | Wt 153.0 lb

## 2013-03-30 DIAGNOSIS — Z85828 Personal history of other malignant neoplasm of skin: Secondary | ICD-10-CM

## 2013-03-30 DIAGNOSIS — E559 Vitamin D deficiency, unspecified: Secondary | ICD-10-CM | POA: Insufficient documentation

## 2013-03-30 DIAGNOSIS — Z23 Encounter for immunization: Secondary | ICD-10-CM

## 2013-03-30 NOTE — Patient Instructions (Addendum)
Confirm whether you are a candidate for shingles vaccine with your oncologist Continue yearly flu vaccine

## 2013-03-30 NOTE — Progress Notes (Signed)
  Subjective:    Patient ID: Emily Taylor, female    DOB: 1946/08/19, 67 y.o.   MRN: [id number]  HPI Patient seen to reestablish care She has history of myelofibrosis followed at [facility]. She has chronic anemia related to this. She takes hydroxyurea. Recent hemoglobin 9 range. She has chronic fatigue and has been evaluated over there we do not have all of her labs.  Hypertension treated with lisinopril HCTZ. Recent electrolytes stable. 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  She's had prior Pneumovax but this is been 5 years ago in her immunizations before age 61. She gets yearly flu vaccines. She has not confirm whether her hematologist recommend shingles vaccine.  Past Medical History  Diagnosis Date  . Anemia   . Cancer   . Cataract   . Hypertension    Past Surgical History  Procedure Laterality Date  . Back surgery    . Cosmetic surgery    . Umbilical hernia repair  2006  . Appendectomy  2007    reports that she has never smoked. She does not have any smokeless tobacco history on file. She reports that she does not drink alcohol or use illicit drugs. family history includes Cancer in her mother; Dementia in her mother; Heart disease in her father; and Hypertension in her father. No Known Allergies    Review of Systems  Constitutional: Positive for fatigue. Negative for fever, chills, appetite change and unexpected weight change.  Respiratory: Negative for cough and shortness of breath.   Cardiovascular: Negative for chest pain, palpitations and leg swelling.  Gastrointestinal: Negative for abdominal pain.  Genitourinary: Positive for vaginal bleeding. Negative for dysuria and hematuria.  Neurological: Negative for dizziness and weakness.       Objective:   Physical Exam  Constitutional: She appears well-developed and well-nourished.  HENT:  Mouth/Throat: Oropharynx is clear and moist.   Neck: Neck supple.  Cardiovascular: Normal rate and regular rhythm.   Pulmonary/Chest: Effort normal and breath sounds normal. No respiratory distress. She has no wheezes. She has no rales.  Musculoskeletal: She exhibits no edema.          Assessment & Plan:  #1 history of myelofibrosis. Followed by hematologist in [place] #2 hypertension stable #3 history of multiple skin cancers as above followed by dermatology #4 health maintenance. Patient needs repeat Pneumovax based on the fact  5 years ago had prior Pneumovax before age 71. Continue yearly flu vaccine. She will confirm whether they approve shingles vaccine

## 2014-03-02 ENCOUNTER — Encounter: Payer: Self-pay | Admitting: Family Medicine

## 2014-03-02 ENCOUNTER — Ambulatory Visit (INDEPENDENT_AMBULATORY_CARE_PROVIDER_SITE_OTHER): Payer: Medicare Other | Admitting: Family Medicine

## 2014-03-02 VITALS — BP 134/66 | HR 84 | Temp 97.9°F | Ht 63.0 in | Wt 142.0 lb

## 2014-03-02 DIAGNOSIS — R161 Splenomegaly, not elsewhere classified: Secondary | ICD-10-CM

## 2014-03-02 DIAGNOSIS — I10 Essential (primary) hypertension: Secondary | ICD-10-CM

## 2014-03-02 MED ORDER — LISINOPRIL-HYDROCHLOROTHIAZIDE 10-12.5 MG PO TABS: 1.0000 | ORAL_TABLET | Freq: Every day | ORAL | Status: DC

## 2014-03-02 NOTE — Patient Instructions (Signed)
Let us know when ready to schedule colonoscopy.

## 2014-03-02 NOTE — Progress Notes (Signed)
   Subjective:    Patient ID: Emily Taylor, female    DOB: 05/25/1946, 68 y.o.   MRN: [id number]  HPI Patient seen for medical followup. She has history of myelofibrosis followed at [facility]. She is maintained on hydroxyurea. Recent hemoglobin 7.9%. She has some fatigue issues. No dizziness. No syncope. She has some splenomegaly related to her myelofibrosis. They're trying to avoid transfusions this point.  Hypertension treated with lisinopril HCTZ. Blood pressure well-controlled. Weight stable. No dizziness. No syncope.  Regarding health maintenance, she has been advised to avoid shingles vaccine. She is unsure of last tetanus. She gets yearly flu vaccines.  Past Medical History  Diagnosis Date  . Anemia   . Cancer   . Cataract   . Hypertension    Past Surgical History  Procedure Laterality Date  . Back surgery    . Cosmetic surgery    . Umbilical hernia repair  2006  . Appendectomy  2007    reports that she has never smoked. She does not have any smokeless tobacco history on file. She reports that she does not drink alcohol or use illicit drugs. family history includes Cancer in her mother; Dementia in her mother; Heart disease in her father; Hypertension in her father. No Known Allergies    Review of Systems  Constitutional: Negative for fatigue and unexpected weight change.  Eyes: Negative for visual disturbance.  Respiratory: Negative for cough, chest tightness, shortness of breath and wheezing.   Cardiovascular: Negative for chest pain, palpitations and leg swelling.  Endocrine: Negative for polydipsia and polyuria.  Neurological: Negative for dizziness, seizures, syncope, weakness, light-headedness and headaches.       Objective:   Physical Exam  Constitutional: She appears well-developed and well-nourished.  Neck: Neck supple. No thyromegaly present.  Cardiovascular: Regular rhythm.   Pulmonary/Chest: Effort normal and breath sounds normal. No respiratory  distress. She has no wheezes. She has no rales.  Abdominal: Soft. She exhibits mass.  She has splenomegaly which is palpated at least 10 cm below the left costal margin. Nontender. No hepatomegaly  Musculoskeletal: She exhibits no edema.          Assessment & Plan:  #1 hypertension. Adequate control. Refill medication for one year. Recent labs from her oncologist reviewed and these included chemistries which were all stable #2 myelofibrosis with chronic anemia. She had multiple recent labs including B12 and iron studies which were normal. She has splenomegaly which is related to this. #3 health maintenance. She will get yearly flu vaccine. Consider Prevnar 13 next year. Patient has never had screening colonoscopy and we'll consider this fall

## 2014-03-02 NOTE — Progress Notes (Signed)
Pre visit review using our clinic review tool, if applicable. No additional management support is needed unless otherwise documented below in the visit note. 

## 2014-03-03 ENCOUNTER — Telehealth: Payer: Self-pay | Admitting: Family Medicine

## 2014-03-03 NOTE — Telephone Encounter (Signed)
Relevant patient education mailed to patient.  

## 2014-03-04 ENCOUNTER — Other Ambulatory Visit: Payer: Self-pay | Admitting: Family Medicine

## 2015-02-13 ENCOUNTER — Telehealth: Payer: Self-pay

## 2015-02-13 NOTE — Telephone Encounter (Signed)
Left message home voicemail to call us back (concerning overdue mammogram)

## 2015-02-13 NOTE — Telephone Encounter (Signed)
Pt states it has been a while.  She will make appt w/ dr Elease Hashimoto and get referral then

## 2015-02-22 ENCOUNTER — Ambulatory Visit (INDEPENDENT_AMBULATORY_CARE_PROVIDER_SITE_OTHER): Payer: Medicare Other | Admitting: Family Medicine

## 2015-02-22 ENCOUNTER — Encounter: Payer: Self-pay | Admitting: Family Medicine

## 2015-02-22 VITALS — BP 130/70 | HR 90 | Temp 97.7°F | Wt 143.0 lb

## 2015-02-22 DIAGNOSIS — I10 Essential (primary) hypertension: Secondary | ICD-10-CM

## 2015-02-22 DIAGNOSIS — D7581 Myelofibrosis: Secondary | ICD-10-CM | POA: Diagnosis not present

## 2015-02-22 MED ORDER — LISINOPRIL-HYDROCHLOROTHIAZIDE 10-12.5 MG PO TABS: 1.0000 | ORAL_TABLET | Freq: Every day | ORAL | Status: AC

## 2015-02-22 NOTE — Patient Instructions (Signed)

## 2015-02-22 NOTE — Progress Notes (Signed)
   Subjective:    Patient ID: Emily Taylor, female    DOB: [birth year], 69 y.o.   MRN: [id number]  HPI Patient seen for follow-up hypertension.  This is been well controlled for several years on lisinopril HCTZ. She needs refills. No dizziness. She has myelofibrosis followed at [facility]. This was diagnosed over 7 years ago. Recent hemoglobin 8.3. She's been remarkably stable. She has splenomegaly related to her myelofibrosis. Also history of squamous cell skin cancer followed by dermatology. She is walking some for exercise. No chest pains.  Past Medical History  Diagnosis Date  . Anemia   . Cancer   . Cataract   . Hypertension   . Myelofibrosis    Past Surgical History  Procedure Laterality Date  . Back surgery    . Cosmetic surgery    . Umbilical hernia repair  2006  . Appendectomy  2007    reports that she has never smoked. She does not have any smokeless tobacco history on file. She reports that she does not drink alcohol or use illicit drugs. family history includes Cancer in her mother; Dementia in her mother; Heart disease in her father; Hypertension in her father. No Known Allergies    Review of Systems  Constitutional: Positive for fatigue. Negative for unexpected weight change.  Eyes: Negative for visual disturbance.  Respiratory: Negative for cough, chest tightness, shortness of breath and wheezing.   Cardiovascular: Negative for chest pain, palpitations and leg swelling.  Neurological: Negative for dizziness, seizures, syncope, weakness, light-headedness and headaches.       Objective:   Physical Exam  Constitutional: She appears well-developed and well-nourished.  Neck: Neck supple. No thyromegaly present.  Cardiovascular: Normal rate and regular rhythm.   Pulmonary/Chest: Effort normal and breath sounds normal. No respiratory distress. She has no wheezes. She has no rales.  Abdominal:  She has huge spleen which is palpated 20 cm below the  left costal margin.  Musculoskeletal: She exhibits no edema.          Assessment & Plan:  Hypertension. Stable and at goal. Refill medication for one year. She gets regular lab work through [facility] and will defer labs to them. She is encouraged to consider Prevnar 13 but she declines and information given. Encourage yearly flu vaccine.

## 2015-02-22 NOTE — Progress Notes (Signed)
Pre visit review using our clinic review tool, if applicable. No additional management support is needed unless otherwise documented below in the visit note. 

## 2015-05-17 ENCOUNTER — Encounter: Payer: Self-pay | Admitting: Family Medicine

## 2016-01-21 ENCOUNTER — Telehealth: Payer: Self-pay | Admitting: Family Medicine

## 2016-01-21 NOTE — Telephone Encounter (Signed)
error 

## 2016-02-21 ENCOUNTER — Telehealth: Payer: Self-pay | Admitting: Family Medicine

## 2016-02-21 NOTE — Telephone Encounter (Signed)
Emily Taylor with [facility] would like to know if  Dr Elease Hashimoto will be her attending physician.

## 2016-02-22 NOTE — Telephone Encounter (Signed)
yes

## 2016-02-22 NOTE — Telephone Encounter (Signed)
Notified hospice  

## 2016-02-25 ENCOUNTER — Telehealth: Payer: Self-pay | Admitting: *Deleted

## 2016-02-25 NOTE — Telephone Encounter (Signed)
I called Otila Kluver back and informed her of the message below.  She stated she needs an order for Lorazepam for anxiety and Morphine for SOB and I advised her we would need to ask Dr Elease Hashimoto. She asked if we received a fax from [facility] looked for this and did not see this and she stated she would call Dr Sonny Dandy as the pt needs this tonite.   Dr Elease Hashimoto was informed that she needed a verbal order for this and he approved both medications for the pt.  Otila Kluver stated she is out in the field now and will fax this to our office tomorrow and she stated [facility] will send a fax also.

## 2016-02-25 NOTE — Telephone Encounter (Signed)
TIna, RN with [facility] called to request standing orders for Lorazepam 5mg -every 4 hours as needed for anxiety and also Lorazepam 5mg -every 15 minutes as needed for mild SOB.  States the pt has O2 but is waking up and she feels this may help the pt. States a fax may have been sent to our office already on the pt but she would like a verbal order.  Please call Otila Kluver at [phone].

## 2016-02-25 NOTE — Telephone Encounter (Signed)
OK to approve- but this should be Lorazepam 0.5 mg and not 5 mg

## 2016-02-26 NOTE — Telephone Encounter (Signed)
Orders received and have been faxed from both resources.

## 2016-03-06 ENCOUNTER — Telehealth: Payer: Self-pay | Admitting: Family Medicine

## 2016-03-06 NOTE — Telephone Encounter (Signed)
Otila Kluver from [facility] called to let you know that the patient passed away

## 2016-03-06 NOTE — Telephone Encounter (Signed)
Emily Taylor states time of death 9:53 pm [facility]osp[facility] appreciates all you help with her

## 2016-03-07 NOTE — Telephone Encounter (Signed)
Spoke with her husband and gave our condolences.  He has very supportive family and also great support from Hosp[facility].

## 2016-03-08 DEATH — deceased
